# Patient Record
Sex: Male | Born: 1945 | Race: White | Hispanic: No | Marital: Married | Smoking: Never smoker
Health system: Southern US, Community
[De-identification: ages and names within clinical notes are randomized; demographics above are authoritative.]

## PROBLEM LIST (undated history)

## (undated) DIAGNOSIS — J45909 Unspecified asthma, uncomplicated: Secondary | ICD-10-CM

## (undated) HISTORY — PX: HERNIA REPAIR: SHX51

---

## 2019-03-20 ENCOUNTER — Other Ambulatory Visit: Payer: Self-pay

## 2019-03-20 ENCOUNTER — Emergency Department (HOSPITAL_COMMUNITY): Payer: 59

## 2019-03-20 ENCOUNTER — Emergency Department (HOSPITAL_COMMUNITY)
Admission: EM | Admit: 2019-03-20 | Discharge: 2019-03-21 | Disposition: A | Discharge status: Home / Self Care | Payer: 59 | Attending: Emergency Medicine | Admitting: Emergency Medicine

## 2019-03-20 ENCOUNTER — Encounter (HOSPITAL_COMMUNITY): Payer: Self-pay

## 2019-03-20 DIAGNOSIS — Z20822 Contact with and (suspected) exposure to covid-19: Secondary | ICD-10-CM | POA: Insufficient documentation

## 2019-03-20 DIAGNOSIS — Z79899 Other long term (current) drug therapy: Secondary | ICD-10-CM | POA: Diagnosis not present

## 2019-03-20 DIAGNOSIS — R509 Fever, unspecified: Secondary | ICD-10-CM | POA: Diagnosis not present

## 2019-03-20 DIAGNOSIS — J45909 Unspecified asthma, uncomplicated: Secondary | ICD-10-CM | POA: Insufficient documentation

## 2019-03-20 DIAGNOSIS — R251 Tremor, unspecified: Secondary | ICD-10-CM | POA: Diagnosis present

## 2019-03-20 HISTORY — DX: Unspecified asthma, uncomplicated: J45.909

## 2019-03-20 LAB — URINALYSIS, ROUTINE W REFLEX MICROSCOPIC
Bilirubin Urine: NEGATIVE
Glucose, UA: NEGATIVE mg/dL
Hgb urine dipstick: NEGATIVE
Ketones, ur: NEGATIVE mg/dL
Leukocytes,Ua: NEGATIVE
Nitrite: NEGATIVE
Protein, ur: NEGATIVE mg/dL
Specific Gravity, Urine: 1.01 (ref 1.005–1.030)
pH: 6 (ref 5.0–8.0)

## 2019-03-20 LAB — CBC
HCT: 41.8 % (ref 39.0–52.0)
Hemoglobin: 14.3 g/dL (ref 13.0–17.0)
MCH: 31.5 pg (ref 26.0–34.0)
MCHC: 34.2 g/dL (ref 30.0–36.0)
MCV: 92.1 fL (ref 80.0–100.0)
Platelets: 175 10*3/uL (ref 150–400)
RBC: 4.54 MIL/uL (ref 4.22–5.81)
RDW: 12.8 % (ref 11.5–15.5)
WBC: 11 10*3/uL — ABNORMAL HIGH (ref 4.0–10.5)
nRBC: 0 % (ref 0.0–0.2)

## 2019-03-20 LAB — TROPONIN I (HIGH SENSITIVITY): Troponin I (High Sensitivity): 6 ng/L (ref <18)

## 2019-03-20 LAB — BASIC METABOLIC PANEL
Anion gap: 8 (ref 5–15)
BUN: 15 mg/dL (ref 8–23)
CO2: 24 mmol/L (ref 22–32)
Calcium: 8.7 mg/dL — ABNORMAL LOW (ref 8.9–10.3)
Chloride: 106 mmol/L (ref 98–111)
Creatinine, Ser: 0.97 mg/dL (ref 0.61–1.24)
GFR calc Af Amer: >60 mL/min (ref >60)
GFR calc non Af Amer: >60 mL/min (ref >60)
Glucose, Bld: 103 mg/dL — ABNORMAL HIGH (ref 70–99)
Potassium: 3.7 mmol/L (ref 3.5–5.1)
Sodium: 138 mmol/L (ref 135–145)

## 2019-03-20 LAB — CBG MONITORING, ED: Glucose-Capillary: 108 mg/dL — ABNORMAL HIGH (ref 70–99)

## 2019-03-20 LAB — POC SARS CORONAVIRUS 2 AG -  ED: SARS Coronavirus 2 Ag: NEGATIVE

## 2019-03-20 LAB — LACTIC ACID, PLASMA: Lactic Acid, Venous: 0.9 mmol/L (ref 0.5–1.9)

## 2019-03-20 MED ORDER — ACETAMINOPHEN 500 MG PO TABS
1000.0000 mg | ORAL_TABLET | Freq: Once | ORAL | Status: AC
  Administered 2019-03-20: 1000 mg via ORAL
  Filled 2019-03-20: qty 2

## 2019-03-20 MED ORDER — SODIUM CHLORIDE 0.9 % IV BOLUS
1000.0000 mL | Freq: Once | INTRAVENOUS | Status: AC
  Administered 2019-03-20: 1000 mL via INTRAVENOUS

## 2019-03-20 MED ORDER — SODIUM CHLORIDE 0.9% FLUSH: 3.0000 mL | Freq: Once | INTRAVENOUS | Status: DC

## 2019-03-20 NOTE — ED Provider Notes (Signed)
[facility]-EMERGENCY DEPT Provider Note   CSN: [id number] Arrival date & time: 03/20/19  2005     History Chief Complaint  Patient presents with  . Shaking    Frank Vaughn is a 74 y.o. male possible history of asthma who presents for evaluation of episode of chills/rigors that began about 6 PM this evening.  Patient reports that he was at his daughter's house and states that he started having chills diffusely.  He felt like he was shaking all over.  He did not lose consciousness and did not have any seizure activity.  He states he felt weak and fatigued all over.  No focal weakness.  He states that this episode lasted for about 3 to 4 hours.  Since then, it is slightly improved.  He states now, he does not have any chills but he just feels tired and fatigued.  He states he has had a cough but states he has a history of asthma and states that this is normal for him.  He has recently traveled from [place] to be with his daughter.  No known COVID-19 exposure.  He states that he has gotten both of his Covid vaccines.  He denies any chest pain, difficulty breathing, abdominal pain, nausea/vomiting, urinary complaints, numbness/weakness of his arms or legs, rashes, area of redness.  The history is provided by the patient.       Past Medical History:  Diagnosis Date  . Asthma     There are no problems to display for this patient.   Past Surgical History:  Procedure Laterality Date  . HERNIA REPAIR         No family history on file.  Social History   Tobacco Use  . Smoking status: Never Smoker  . Smokeless tobacco: Never Used  Substance Use Topics  . Alcohol use: Never  . Drug use: Never    Home Medications Prior to Admission medications   Medication Sig Start Date End Date Taking? Authorizing Provider  azelastine (ASTELIN) 0.1 % nasal spray Place 2 sprays into both nostrils 2 (two) times daily. 03/06/19  Yes [provider]  buPROPion  (WELLBUTRIN XL) 300 MG 24 hr tablet Take 300 mg by mouth daily. 02/06/19  Yes [provider]  EPINEPHrine (EPIPEN 2-PAK) 0.3 mg/0.3 mL IJ SOAJ injection Inject 0.3 mg into the muscle as needed for anaphylaxis.   Yes [provider]  fluticasone (FLONASE) 50 MCG/ACT nasal spray Place 1 spray into both nostrils in the morning and at bedtime.   Yes [provider]  montelukast (SINGULAIR) 10 MG tablet Take 10 mg by mouth daily. 02/16/19  Yes [provider]  pantoprazole (PROTONIX) 20 MG tablet Take 20 mg by mouth 2 (two) times daily. 01/02/19  Yes [provider]  SYMBICORT 160-4.5 MCG/ACT inhaler Inhale 2 puffs into the lungs in the morning and at bedtime.  11/22/18  Yes [provider]  VENTOLIN HFA 108 (90 Base) MCG/ACT inhaler Inhale 2 puffs into the lungs every 6 (six) hours as needed for wheezing or shortness of breath.  03/07/19  Yes [provider]    Allergies    Penicillins  Review of Systems   Review of Systems  Constitutional: Positive for chills and fatigue. Negative for fever.  Respiratory: Negative for cough and shortness of breath.   Cardiovascular: Negative for chest pain.  Gastrointestinal: Negative for abdominal pain, nausea and vomiting.  Genitourinary: Negative for dysuria and hematuria.  Neurological: Positive for weakness (generalized).  Negative for headaches.  All other systems reviewed and are negative.   Physical Exam Updated Vital Signs BP 110/63   Pulse 84   Temp 98.3 F (36.8 C) (Oral)   Resp 16   Wt 83.9 kg   SpO2 98%   Physical Exam Vitals and nursing note reviewed.  Constitutional:      Appearance: Normal appearance. He is well-developed.  HENT:     Head: Normocephalic and atraumatic.  Eyes:     General: Lids are normal.     Conjunctiva/sclera: Conjunctivae normal.     Pupils: Pupils are equal, round, and reactive to light.  Cardiovascular:     Rate and Rhythm: Normal rate and regular  rhythm.     Pulses: Normal pulses.     Heart sounds: Normal heart sounds. No murmur. No friction rub. No gallop.   Pulmonary:     Effort: Pulmonary effort is normal.     Breath sounds: Wheezing and rales present.     Comments: Mild rales noted at bilateral bases with some overlying wheezing.  No evidence of respiratory distress.  Able speak in full sentences without any difficulty. Abdominal:     Palpations: Abdomen is soft. Abdomen is not rigid.     Tenderness: There is no abdominal tenderness. There is no guarding.     Comments: Abdomen is soft, non-distended, non-tender. No rigidity, No guarding. No peritoneal signs.  Musculoskeletal:        General: Normal range of motion.     Cervical back: Full passive range of motion without pain.  Skin:    General: Skin is warm and dry.     Capillary Refill: Capillary refill takes less than 2 seconds.  Neurological:     Mental Status: He is alert and oriented to person, place, and time.     Comments: Follows commands, Moves all extremities  5/5 strength to BUE and BLE  Sensation intact throughout all major nerve distributions  Psychiatric:        Speech: Speech normal.     ED Results / Procedures / Treatments   Labs (all labs ordered are listed, but only abnormal results are displayed) Labs Reviewed  BASIC METABOLIC PANEL - Abnormal; Notable for the following components:      Result Value   Glucose, Bld 103 (*)    Calcium 8.7 (*)    All other components within normal limits  CBC - Abnormal; Notable for the following components:   WBC 11.0 (*)    All other components within normal limits  CBG MONITORING, ED - Abnormal; Notable for the following components:   Glucose-Capillary 108 (*)    All other components within normal limits  CULTURE, BLOOD (ROUTINE X 2)  CULTURE, BLOOD (ROUTINE X 2)  SARS CORONAVIRUS 2 (TAT 6-24 HRS)  URINALYSIS, ROUTINE W REFLEX MICROSCOPIC  LACTIC ACID, PLASMA  LACTIC ACID, PLASMA  POC SARS CORONAVIRUS 2  AG -  ED  TROPONIN I (HIGH SENSITIVITY)  TROPONIN I (HIGH SENSITIVITY)    EKG EKG Interpretation  Date/Time:  Monday March 20 2019 20:42:37 EST Ventricular Rate:  113 PR Interval:    QRS Duration: 156 QT Interval:  369 QTC Calculation: 506 R Axis:   -132 Text Interpretation: Sinus tachycardia Right bundle branch block Baseline wander in lead(s) I II aVR No previous ECGs available Confirmed by Kennis Carina [phone]) on 03/20/2019 10:25:36 PM   Radiology DG Chest Portable 1 View  Result Date: 03/20/2019 CLINICAL DATA:  Fever and chills. EXAM: PORTABLE  CHEST 1 VIEW COMPARISON:  None. FINDINGS: There is no focal infiltrate. No large pleural effusion. Aortic calcifications are noted. The heart size is mildly enlarged. There is a bulbous appearance within the left hilum. There is no acute osseous abnormality. No pneumothorax. IMPRESSION: 1. No definite acute cardiopulmonary process. 2. Rounded density within the left hilum may represent an enlarged pulmonary artery. Further evaluation with a contrast enhanced CT of the chest is recommended. Electronically Signed   By: Constance Holster M.D.   On: 03/20/2019 22:33    Procedures Procedures (including critical care time)  Medications Ordered in ED Medications  sodium chloride flush (NS) 0.9 % injection 3 mL (3 mLs Intravenous Not Given 03/20/19 2131)  sodium chloride 0.9 % bolus 1,000 mL (0 mLs Intravenous Stopped 03/21/19 0129)  acetaminophen (TYLENOL) tablet 1,000 mg (1,000 mg Oral Given 03/20/19 2306)    ED Course  I have reviewed the triage vital signs and the nursing notes.  Pertinent labs & imaging results that were available during my care of the patient were reviewed by me and considered in my medical decision making (see chart for details).    MDM Rules/Calculators/A&P                      74 year old male who presents for evaluation of rigors/chills that began about 6 PM this evening.  He states he felt tired and fatigued.  No  fever.  He has had some cough but states that is normal for his asthma.  No difficulty breathing, chest pain, abdominal pain, urinary complaints.  On initially arrival, he is a low-grade temp oral.  He is slightly tachycardic.  Vitals otherwise stable.  On exam, he does have some mild rales noted bilateral bases as well as some overlying wheezing.  No evidence of respiratory distress.  Benign abdominal exam.  Question if this is infectious etiology versus COVID-19.  Plan to check labs, chest x-Tremar.  Will give fluids for tachycardia.  Rectal temp is 101.7. Will give tylenol.   CBC shows slight leukocytosis of 11.0.  BMP shows normal renal creatinine.  UA negative for any infectious etiology. Lactic acid is 0.9. Rapid covid is negative. Trop is negative.   CXR shows no definitive acute cardiopulmonary process. There is a rounded density within the left hilum may represent an enlarged pulmonary artery.  This was discussed with Dr. Sedonia Small.  Patient has had a recent echo and CT scan of his chest.  Does not wish to repeat it today which feels reasonable.  Reevaluation.  Patient is resting comfortably.  His initial rapid Covid was negative.  We will plan to do a send out Covid.  His temperature has improved and his heart rate has improved with fluids.  Patient is resting comfortably without any signs of distress.  I discussed with patient regarding treatment options.  I discussed at length regarding his work-up here in the ED.  At this time, there is no clear etiology of what is source of fever is.  I did discuss with him that it could still be Covid related or could be another process.  I am reassured that his lactic acid is negative.  And we do a blood cultures pending on him.  I discussed at length regarding admission versus discharge home.  I did offer patient admission and we discussed risk versus benefits of patient declining admission going home.  After extensive discussion, patient stated he would rather try  to go home.  I  did discuss the risk first benefits and patient expressed understanding.  He appears clinically sober and able to make full medical decision making capacity.  I also discussed with his wife regarding his decision and wife agrees and understands.  I did discuss with patient that at any time, he can return the emergency department for any worsening or concerning symptoms.  I discussed with Dr. Orland Dec who independently evaluated patient. At this time, patient exhibits no emergent life-threatening condition that require further evaluation in ED or admission. Patient had ample opportunity for questions and discussion. All patient's questions were answered with full understanding. Strict return precautions discussed. Patient expresses understanding and agreement to plan.   Kyrel Rion was evaluated in Emergency Department on 03/21/2019 for the symptoms described in the history of present illness. He was evaluated in the context of the global COVID-19 pandemic, which necessitated consideration that the patient might be at risk for infection with the SARS-CoV-2 virus that causes COVID-19. Institutional protocols and algorithms that pertain to the evaluation of patients at risk for COVID-19 are in a state of rapid change based on information released by regulatory bodies including the CDC and federal and state organizations. These policies and algorithms were followed during the patient's care in the ED.  Portions of this note were generated with Scientist, clinical (histocompatibility and immunogenetics). Dictation errors may occur despite best attempts at proofreading.   Final Clinical Impression(s) / ED Diagnoses Final diagnoses:  Fever, unspecified fever cause    Rx / DC Orders ED Discharge Orders    None       Maxwell Caul, PA-C 03/21/19 0310    Sabas Sous, MD 03/21/19 1651

## 2019-03-20 NOTE — ED Notes (Signed)
POC covid result Neg(-) MD Bero and PA Mardella Layman have been made aware

## 2019-03-20 NOTE — ED Triage Notes (Addendum)
Pt states that he had chills x 3-4 hours . Pt states that the shaking got so bad he could barely walk. Pt denies SHOB, CP, headache, V/D. Pt endorses mild nausea. Pt ambulatory in triage. Pt endorses occasional dizziness.

## 2019-03-20 NOTE — ED Notes (Signed)
Delay in EKG due to chest hair needing to be shaved.

## 2019-03-20 NOTE — ED Notes (Signed)
Triage delayed, pt to restroom 

## 2019-03-21 LAB — SARS CORONAVIRUS 2 (TAT 6-24 HRS): SARS Coronavirus 2: NEGATIVE

## 2019-03-21 LAB — TROPONIN I (HIGH SENSITIVITY): Troponin I (High Sensitivity): 6 ng/L (ref <18)

## 2019-03-21 LAB — LACTIC ACID, PLASMA: Lactic Acid, Venous: 0.8 mmol/L (ref 0.5–1.9)

## 2019-03-21 NOTE — Discharge Instructions (Signed)
As we discussed today, we do not know the source of your fever.  You can take Tylenol or Ibuprofen as directed for pain or fever. You can alternate Tylenol and Ibuprofen every 4 hours. If you take Tylenol at 1pm, then you can take Ibuprofen at 5pm. Then you can take Tylenol again at 9pm.   You do have a Covid test pending.  This will take about 16 to 18 hours to return.  You can check online regarding the results.  You should quarantine until the results come back.  Additionally, you have blood cultures pending.  If you are positive, you will be notified and you will need to return the emergency department.  If at any time, you feel like your fever is returning, you have chest pain, difficulty breathing, abdominal pain, vomiting or any other worsening or concerning symptoms, he can return emergency department immediately.

## 2019-03-26 LAB — CULTURE, BLOOD (ROUTINE X 2)
Culture: NO GROWTH
Culture: NO GROWTH
Special Requests: ADEQUATE

## 2020-03-30 IMAGING — DX DG CHEST 1V PORT
1 series · 1 of 1 positions shown · non-contrast
Comparison: None.

CLINICAL DATA: Fever and chills.

EXAM:
PORTABLE CHEST 1 VIEW

[chest ap]
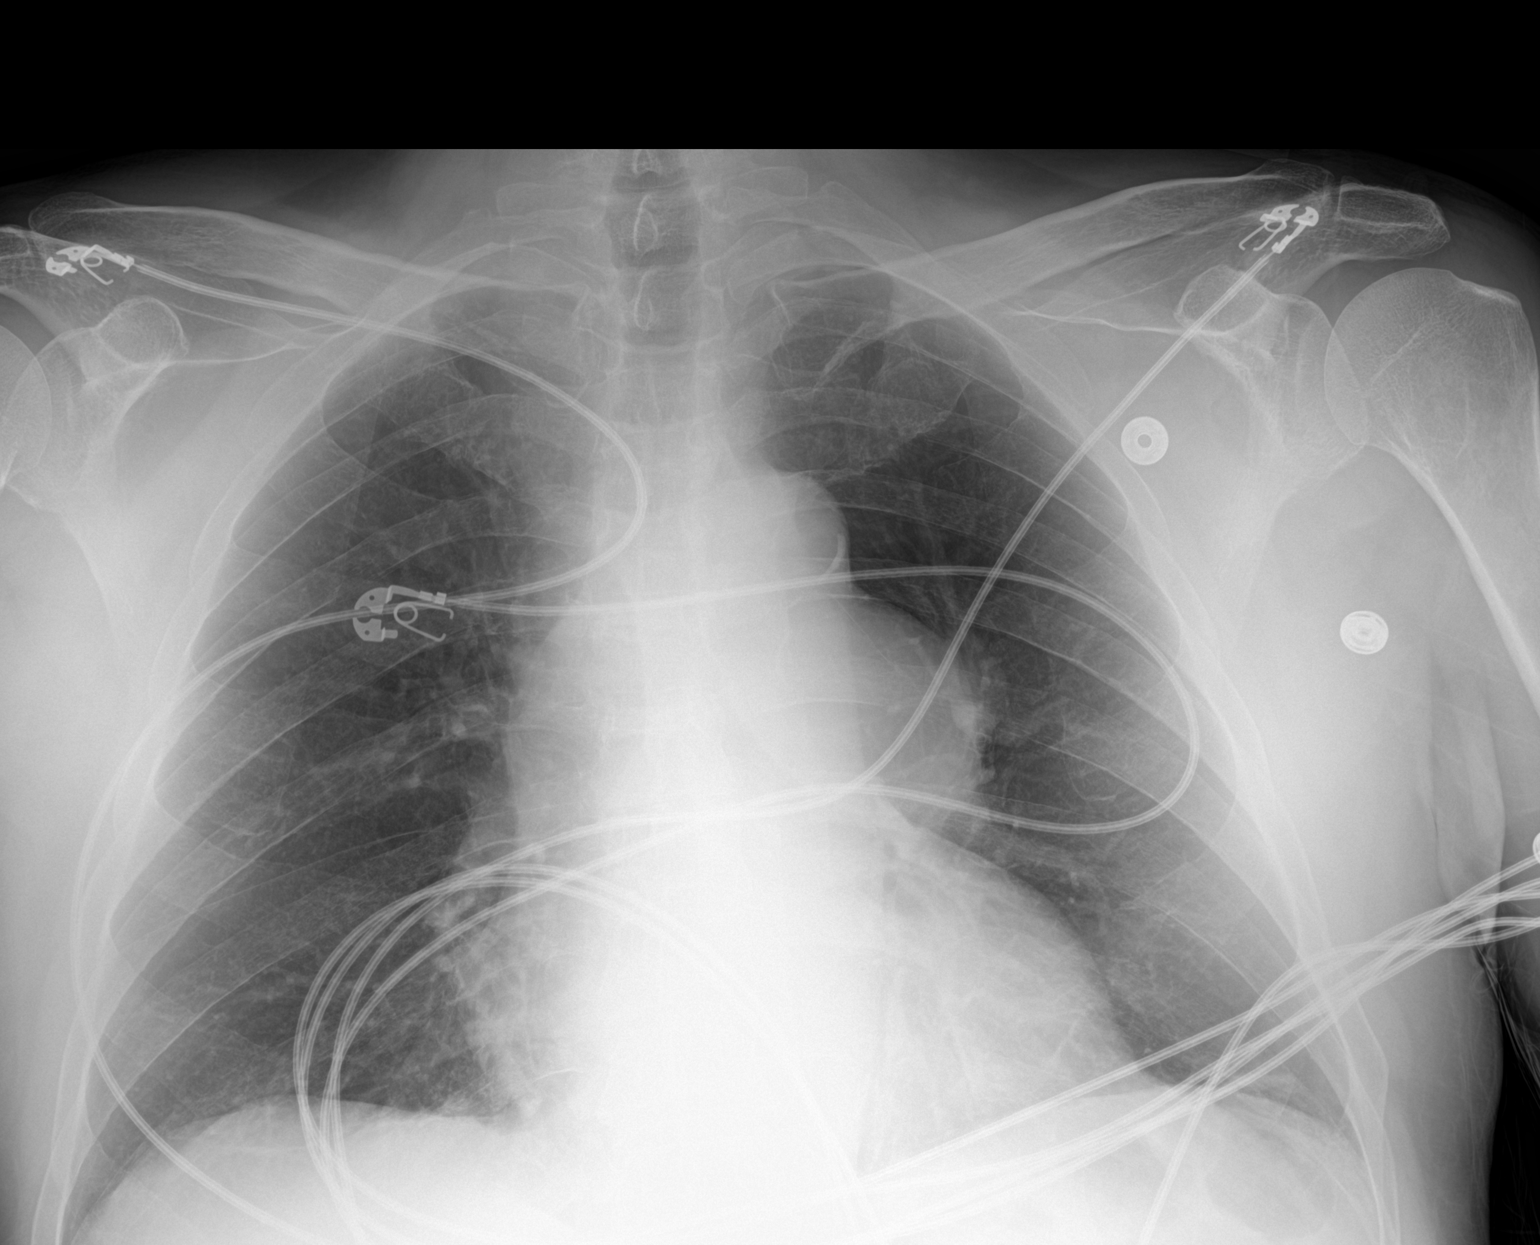

[1 of 1 positions shown; findings below may reference images not displayed]

FINDINGS: There is no focal infiltrate. No large pleural effusion. Aortic
calcifications are noted. The heart size is mildly enlarged. There
is a bulbous appearance within the left hilum. There is no acute
osseous abnormality. No pneumothorax.
IMPRESSION: 1. No definite acute cardiopulmonary process.
2. Rounded density within the left hilum may represent an enlarged
pulmonary artery. Further evaluation with a contrast enhanced CT of
the chest is recommended.
